# Patient Record
Sex: Male | Born: 1995 | Race: Black or African American | Hispanic: No | Marital: Single | Smoking: Never smoker
Health system: Southern US, Community
[De-identification: ages and names within clinical notes are randomized; demographics above are authoritative.]

## PROBLEM LIST (undated history)

## (undated) DIAGNOSIS — J45909 Unspecified asthma, uncomplicated: Secondary | ICD-10-CM

---
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
## 2017-03-09 ENCOUNTER — Encounter: Payer: Self-pay | Admitting: *Deleted

## 2017-03-09 ENCOUNTER — Encounter: Payer: Self-pay | Admitting: Physician Assistant

## 2017-03-09 ENCOUNTER — Ambulatory Visit (INDEPENDENT_AMBULATORY_CARE_PROVIDER_SITE_OTHER): Payer: Self-pay | Admitting: Physician Assistant

## 2017-03-09 VITALS — BP 146/100 | HR 85 | Temp 99.7°F | Ht 69.5 in | Wt 216.0 lb

## 2017-03-09 DIAGNOSIS — R400 Somnolence: Secondary | ICD-10-CM | POA: Diagnosis not present

## 2017-03-09 NOTE — Patient Instructions (Addendum)
It was great to meet you!  You will be contacted about a sleep study. Please have this completed and if the results are normal, we will be happy to complete your forms.  Please contact us if you have not heard about your sleep test within 1 week.
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
## 2017-03-20 ENCOUNTER — Telehealth: Payer: Self-pay | Admitting: Physician Assistant

## 2017-03-20 NOTE — Telephone Encounter (Signed)
Patient called to check the status of his home sleep test. I was advised by Mellody Dance that he would call the patient once he emails the Pulmonary office to keep the patient updated.

## 2017-03-20 NOTE — Telephone Encounter (Signed)
Called and spoke with patient and let him know sleep study was sent to [facility].

## 2017-03-21 ENCOUNTER — Telehealth: Payer: Self-pay | Admitting: Internal Medicine

## 2017-03-21 NOTE — Telephone Encounter (Signed)
Spoke to pt & gave him date and time for hst.  Nothing further needed.

## 2017-03-21 NOTE — Telephone Encounter (Signed)
Order for home sleep study was placed on 03/09/17 by pt's PCP.  [facility] - can you please contact this pt? Thanks.

## 2017-03-21 NOTE — Telephone Encounter (Signed)
Cordelia Pen has pulled this one to be scheduled she should be calling

## 2017-03-30 DIAGNOSIS — G4733 Obstructive sleep apnea (adult) (pediatric): Secondary | ICD-10-CM

## 2017-03-31 ENCOUNTER — Other Ambulatory Visit: Payer: Self-pay | Admitting: *Deleted

## 2017-03-31 DIAGNOSIS — G4733 Obstructive sleep apnea (adult) (pediatric): Secondary | ICD-10-CM | POA: Diagnosis not present

## 2017-03-31 DIAGNOSIS — R400 Somnolence: Secondary | ICD-10-CM

## 2017-04-04 ENCOUNTER — Telehealth: Payer: Self-pay | Admitting: *Deleted

## 2017-04-04 ENCOUNTER — Telehealth: Payer: Self-pay | Admitting: Internal Medicine

## 2017-04-04 NOTE — Telephone Encounter (Signed)
Results are not scanned back to computer yet for me to access

## 2017-04-04 NOTE — Telephone Encounter (Signed)
Spoke with patient. He is aware of results. Nothing else needed at time of call.  

## 2017-04-04 NOTE — Telephone Encounter (Signed)
Called and spoke with pt and he is aware that we will call once these results have been reviewed by CY.  CY please advise once completed. Thanks

## 2017-04-04 NOTE — Telephone Encounter (Signed)
Sleep study reprinted and placed on CY's cubby.   CY please advise when able.  Thanks!

## 2017-04-04 NOTE — Telephone Encounter (Signed)
Home sleep test showed very minimal obstructive sleep apnea, with 5.8 apneas per hour. The upper limit of normal is 5.0/ hour. We will send a copy of the sleep study to Paul Kim, [place]. She ordered the study, so she can discuss any treatment recommendations as outlined in the report.

## 2017-04-04 NOTE — Telephone Encounter (Signed)
Spoke to pt, told him we received results of sleep study. He has mild sleep apnea. Pt verbalized understanding. Lelon MastSamantha said she will review the forms you need and see if she can sign them now. Need to make an appointment. Pt verbalized understanding and will call back to schedule.

## 2017-04-07 ENCOUNTER — Telehealth: Payer: Self-pay | Admitting: Physician Assistant

## 2017-04-07 ENCOUNTER — Ambulatory Visit (INDEPENDENT_AMBULATORY_CARE_PROVIDER_SITE_OTHER): Payer: Medicaid Other | Admitting: Physician Assistant

## 2017-04-07 ENCOUNTER — Ambulatory Visit: Payer: Self-pay | Admitting: Physician Assistant

## 2017-04-07 ENCOUNTER — Encounter: Payer: Self-pay | Admitting: Physician Assistant

## 2017-04-07 VITALS — BP 138/80 | HR 61 | Temp 98.8°F | Ht 69.5 in | Wt 218.0 lb

## 2017-04-07 DIAGNOSIS — G4733 Obstructive sleep apnea (adult) (pediatric): Secondary | ICD-10-CM

## 2017-04-07 DIAGNOSIS — Z0289 Encounter for other administrative examinations: Secondary | ICD-10-CM

## 2017-04-07 NOTE — Telephone Encounter (Signed)
Left message on voicemail to call office on home number, mobile # unable to leave message voicemail box was not set up yet. [facility] [place]

## 2017-04-07 NOTE — Patient Instructions (Signed)
It was great to see you today!  Please let us know if you have any further questions about the forms that we have completed at today's visit.  Follow-up as needed.

## 2017-04-07 NOTE — Telephone Encounter (Signed)
Patient called in reference to needing AVS from today's visit [facility] [place] at [phone]. Please call patient with any questions and when fax has been sent.

## 2017-04-07 NOTE — Telephone Encounter (Signed)
Patient called in reference to missed call. I informed patient of note below. Patient voiced understanding.
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
## 2017-04-16 ENCOUNTER — Ambulatory Visit (HOSPITAL_COMMUNITY)
Admission: EM | Admit: 2017-04-16 | Discharge: 2017-04-16 | Disposition: A | Discharge status: Home / Self Care | Payer: Worker's Compensation | Attending: Family Medicine | Admitting: Family Medicine

## 2017-04-16 ENCOUNTER — Encounter (HOSPITAL_COMMUNITY): Payer: Self-pay | Admitting: Emergency Medicine

## 2017-04-16 DIAGNOSIS — S61213A Laceration without foreign body of left middle finger without damage to nail, initial encounter: Secondary | ICD-10-CM | POA: Diagnosis not present

## 2017-04-16 NOTE — Discharge Instructions (Signed)
Dermabond will remove on own once wound heals. Do not soak area in water, keep area clean and dry. Take ibuprofen/tylenol for pain. Monitor for any worsening of symptoms, spreading redness, increased warmth, increased pain, follow up for reevaluation.

## 2017-04-16 NOTE — ED Provider Notes (Signed)
[facility]    CSN: [id number] Arrival date & time: 04/16/17  1540     History   Chief Complaint Chief Complaint  Patient presents with  . Laceration    HPI [facility] is a 21 y.o. male.   21 year old male comes in for a few hour history of laceration of left middle finger while at work. Patient states obtained laceration from tea dispenser. Denies pain, numbness/tingling. Bleeding controlled with pressure. Last tetanus injection from routine vaccination.       Past Medical History:  Diagnosis Date  . Asthma    Exercised induced as a child, has outgrown it    There are no active problems to display for this patient.   Past Surgical History:  Procedure Laterality Date  . WISDOM TOOTH EXTRACTION Bilateral 2017       Home Medications    Prior to Admission medications   Medication Sig Start Date End Date Taking? Authorizing Provider  acetaminophen (TYLENOL) 325 MG tablet Take 650 mg by mouth as needed.    [provider]  naproxen sodium (ALEVE) 220 MG tablet Take 440 mg by mouth as needed.    [provider]    Family History Family History  Problem Relation Age of Onset  . Breast cancer Mother   . Diabetes Brother   . Diabetes Maternal Grandmother   . Breast cancer Maternal Grandmother     Social History Social History  Substance Use Topics  . Smoking status: Never Smoker  . Smokeless tobacco: Never Used  . Alcohol use No     Allergies   Patient has no known allergies.   Review of Systems Review of Systems  Constitutional: Negative for chills, diaphoresis and fever.  Skin: Positive for wound.     Physical Exam Triage Vital Signs ED Triage Vitals [04/16/17 1607]  Enc Vitals Group     BP (!) 149/92     Pulse Rate 62     Resp 16     Temp 98.4 F (36.9 C)     Temp Source Oral     SpO2 100 %     Weight      Height      Head Circumference      Peak Flow      Pain Score      Pain Loc    Pain Edu?      Excl. in GC?    No data found.   Updated Vital Signs BP (!) 149/92 (BP Location: Left Arm)   Pulse 62   Temp 98.4 F (36.9 C) (Oral)   Resp 16   SpO2 100%   Physical Exam  Constitutional: He is oriented to person, place, and time. He appears well-developed and well-nourished. No distress.  HENT:  Head: Normocephalic and atraumatic.  Eyes: Pupils are equal, round, and reactive to light. Conjunctivae are normal.  Musculoskeletal:  1cm laceration to palmer aspect of left middle distal finger. Bleeding controlled with pressure. Full ROM of finger. No tendon/bone visible with ROM. Cap refill <2s  Neurological: He is alert and oriented to person, place, and time.     UC Treatments / Results  Labs (all labs ordered are listed, but only abnormal results are displayed) Labs Reviewed - No data to display  EKG  EKG Interpretation None       Radiology No results found.  Procedures .Marland KitchenLaceration Repair Date/Time: 04/16/2017 4:55 PM Performed by: Linward Headland V Authorized by: Elvina Sidle  Consent:    Consent obtained:  Verbal   Consent given by:  Patient   Risks discussed:  Infection, pain, poor cosmetic result and poor wound healing   Alternatives discussed:  No treatment Anesthesia (see MAR for exact dosages):    Anesthesia method:  None Laceration details:    Location:  Finger   Finger location:  L long finger   Length (cm):  1   Depth (mm):  1 Exploration:    Hemostasis achieved with:  Direct pressure and tourniquet   Wound exploration: wound explored through full range of motion and entire depth of wound probed and visualized     Wound extent: no muscle damage noted, no nerve damage noted and no tendon damage noted   Treatment:    Area cleansed with:  Betadine   Amount of cleaning:  Standard   Irrigation solution: wound cleaner.   Irrigation method:  Pressure wash Skin repair:    Repair method:  Tissue adhesive Approximation:     Approximation:  Close   Vermilion border: well-aligned   Post-procedure details:    Dressing:  Splint for protection   Patient tolerance of procedure:  Tolerated well, no immediate complications   (including critical care time)  Medications Ordered in UC Medications - No data to display   Initial Impression / Assessment and Plan / UC Course  I have reviewed the triage vital signs and the nursing notes.  Pertinent labs & imaging results that were available during my care of the patient were reviewed by me and considered in my medical decision making (see chart for details).     Patient tolerated procedure well. Wound care instructions given. Return precautions given.   Final Clinical Impressions(s) / UC Diagnoses   Final diagnoses:  Laceration of left middle finger without foreign body without damage to nail, initial encounter    New Prescriptions New Prescriptions   No medications on file      Belinda Fisher, Cordelia Poche 04/16/17 1658

## 2017-04-16 NOTE — ED Triage Notes (Signed)
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   Last tetanus = unknown... Bleeding controlled.   A&O x4... NAD... Ambulatory

## 2017-04-19 NOTE — Telephone Encounter (Signed)
Patient called and stated that the wrong paperwork was sent to the [facility]. Patient stated that the [facility] needs the medical form where [place] signed off that he can drive again. Please refax the correct forms to the [facility] to their fax [phone]. Patient states that Lelon Mast made a copy and should have that copy to refax the correct form with her signature for driving clearance. Patient would like to be notified once this has been done.

## 2017-04-19 NOTE — Telephone Encounter (Signed)
Spoke to pt, told him I faxed over the form Samantha filled out to [facility] for him. Pt verbalized understanding.
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
## 2017-09-11 ENCOUNTER — Ambulatory Visit: Payer: Self-pay | Admitting: Physician Assistant

## 2017-09-11 ENCOUNTER — Encounter: Payer: Self-pay | Admitting: Physician Assistant

## 2017-09-11 VITALS — BP 130/82 | HR 58 | Temp 98.0°F | Resp 16 | Ht 70.0 in | Wt 224.0 lb

## 2017-09-11 DIAGNOSIS — G4733 Obstructive sleep apnea (adult) (pediatric): Secondary | ICD-10-CM

## 2017-09-11 DIAGNOSIS — Z0289 Encounter for other administrative examinations: Secondary | ICD-10-CM

## 2018-05-18 ENCOUNTER — Encounter: Payer: Medicaid Other | Admitting: Physician Assistant

## 2018-05-18 NOTE — Progress Notes (Deleted)
I acted as a Neurosurgeon for [employer], PA-C Corky Mull, LPN  Subjective:    [facility] is a 22 y.o. male and is here for a comprehensive physical exam.  HPI  Health Maintenance Due  Topic Date Due  . HIV Screening  06/05/2012  . INFLUENZA VACCINE  03/01/2018    Acute Concerns:   Chronic Issues:   Health Maintenance: Immunizations -- *** Colonoscopy -- *** PAP -- *** PSA -- *** Diet -- *** Caffeine intake -- *** Sleep habits -- *** Exercise -- *** Weight --    Weight history Wt Readings from Last 10 Encounters:  09/11/17 224 lb (101.6 kg)  04/07/17 218 lb (98.9 kg) (97 %, Z= 1.81)*  03/09/17 216 lb (98 kg) (96 %, Z= 1.78)*  09/01/14 189 lb 13.1 oz (86.1 kg) (93 %, Z= 1.47)*   * Growth percentiles are based on CDC (Boys, 2-20 Years) data.   Mood -- *** Tobacco use -- *** Alcohol use --- ***  Depression screen PHQ 2/9 03/09/2017  Decreased Interest 0  Down, Depressed, Hopeless 0  PHQ - 2 Score 0     Other providers/specialists:  PMHx, SurgHx, SocialHx, Medications, and Allergies were reviewed in the Visit Navigator and updated as appropriate.   Past Medical History:  Diagnosis Date  . Asthma    Exercised induced as a child, has outgrown it    Past Surgical History:  Procedure Laterality Date  . WISDOM TOOTH EXTRACTION Bilateral 2017    Family History  Problem Relation Age of Onset  . Breast cancer Mother   . Diabetes Brother   . Diabetes Maternal Grandmother   . Breast cancer Maternal Grandmother     Social History   Tobacco Use  . Smoking status: Never Smoker  . Smokeless tobacco: Never Used  Substance Use Topics  . Alcohol use: No  . Drug use: No    Review of Systems:   ROS  Objective:   There were no vitals filed for this visit. There is no height or weight on file to calculate BMI.  General Appearance:  Alert, cooperative, no distress, appears stated age  Head:  Normocephalic, without obvious abnormality,  atraumatic  Eyes:  PERRL, conjunctiva/corneas clear, EOM's intact, fundi benign, both eyes       Ears:  Normal TM's and external ear canals, both ears  Nose: Nares normal, septum midline, mucosa normal, no drainage    or sinus tenderness  Throat: Lips, mucosa, and tongue normal; teeth and gums normal  Neck: Supple, symmetrical, trachea midline, no adenopathy; thyroid:  No enlargement/tenderness/nodules; no carotit bruit or JVD  Back:   Symmetric, no curvature, ROM normal, no CVA tenderness  Lungs:   Clear to auscultation bilaterally, respirations unlabored  Chest wall:  No tenderness or deformity  Heart:  Regular rate and rhythm, S1 and S2 normal, no murmur, rub   or gallop  Abdomen:   Soft, non-tender, bowel sounds active all four quadrants, no masses, no organomegaly  Extremities: Extremities normal, atraumatic, no cyanosis or edema  Prostate: Not done.   Skin: Skin color, texture, turgor normal, no rashes or lesions  Lymph nodes: Cervical, supraclavicular, and axillary nodes normal  Neurologic: CNII-XII grossly intact. Normal strength, sensation and reflexes throughout    Assessment/Plan:   There are no diagnoses linked to this encounter.  Well Adult Exam: Labs ordered: {Yes/No:18319::"Yes"}. Patient counseling was done. See below for items discussed. Discussed the patient's BMI.  The BMI {BMI plan (MU NQF measure  421):19504} Follow up {follow-up interval:13814}.  Patient Counseling: [x]   Nutrition: Stressed importance of moderation in sodium/caffeine intake, saturated fat and cholesterol, caloric balance, sufficient intake of fresh fruits, vegetables, and fiber.  [x]   Stressed the importance of regular exercise.   []   Substance Abuse: Discussed cessation/primary prevention of tobacco, alcohol, or other drug use; driving or other dangerous activities under the influence; availability of treatment for abuse.   [x]   Injury prevention: Discussed safety belts, safety helmets, smoke  detector, smoking near bedding or upholstery.   []   Sexuality: Discussed sexually transmitted diseases, partner selection, use of condoms, avoidance of unintended pregnancy  and contraceptive alternatives.   [x]   Dental health: Discussed importance of regular tooth brushing, flossing, and dental visits.  [x]   Health maintenance and immunizations reviewed. Please refer to Health maintenance section.    ***  Jarold Motto, PA-C [facility]

## 2018-05-21 ENCOUNTER — Encounter: Payer: Self-pay | Admitting: Physician Assistant

## 2018-05-30 ENCOUNTER — Encounter: Payer: Medicaid Other | Admitting: Physician Assistant

## 2018-05-31 ENCOUNTER — Encounter: Payer: Medicaid Other | Admitting: Physician Assistant

## 2018-06-04 ENCOUNTER — Encounter: Payer: Medicaid Other | Admitting: Physician Assistant

## 2018-06-26 ENCOUNTER — Ambulatory Visit: Payer: Self-pay | Admitting: Physician Assistant

## 2018-06-26 ENCOUNTER — Encounter: Payer: Self-pay | Admitting: Physician Assistant

## 2018-06-26 ENCOUNTER — Ambulatory Visit: Payer: Medicaid Other | Admitting: Physician Assistant

## 2018-06-26 VITALS — BP 124/78 | HR 67 | Temp 98.5°F | Ht 70.0 in | Wt 214.4 lb

## 2018-06-26 DIAGNOSIS — Z0289 Encounter for other administrative examinations: Secondary | ICD-10-CM

## 2018-06-26 DIAGNOSIS — G4733 Obstructive sleep apnea (adult) (pediatric): Secondary | ICD-10-CM

## 2018-06-26 NOTE — Patient Instructions (Signed)
It was great to see you!  Take care,  Hinley Brimage PA-C  

## 2018-06-26 NOTE — Progress Notes (Signed)
Paul Kim is a 22 y.o. male here for a follow up of a pre-existing problem.   History of Present Illness:   Chief Complaint  Patient presents with  . Form Completion    HPI   Patient presents today for follow-up on his DMV forms from last year.  He received forms to complete again to review his medical issues specifically his minimal obstructive sleep apnea, and to determine whether or not he is still able to maintain his license.  He continues to work 2 jobs however he says he getting at least 8 hours of sleep per night.  He denies any issues with daytime somnolence, gasping for air in the middle the night, or excessive fatigue.  He has lost about 5 pounds since I last saw him last year.  He is overall doing very well.  He denies any issues with driving at this time.  He wears glasses.  Wt Readings from Last 5 Encounters:  06/26/18 214 lb 6.4 oz (97.3 kg)  09/11/17 224 lb (101.6 kg)  04/07/17 218 lb (98.9 kg) (97 %, Z= 1.81)*  03/09/17 216 lb (98 kg) (96 %, Z= 1.78)*  09/01/14 189 lb 13.1 oz (86.1 kg) (93 %, Z= 1.47)*   * Growth percentiles are based on CDC (Boys, 2-20 Years) data.     Past Medical History:  Diagnosis Date  . Asthma    Exercised induced as a child, has outgrown it     Social History   Socioeconomic History  . Marital status: Single    Spouse name: Not on file  . Number of children: Not on file  . Years of education: Not on file  . Highest education level: Not on file  Occupational History  . Not on file  Social Needs  . Financial resource strain: Not on file  . Food insecurity:    Worry: Not on file    Inability: Not on file  . Transportation needs:    Medical: Not on file    Non-medical: Not on file  Tobacco Use  . Smoking status: Never Smoker  . Smokeless tobacco: Never Used  Substance and Sexual Activity  . Alcohol use: No  . Drug use: No  . Sexual activity: Yes  Lifestyle  . Physical activity:    Days per week: Not on file     Minutes per session: Not on file  . Stress: Not on file  Relationships  . Social connections:    Talks on phone: Not on file    Gets together: Not on file    Attends religious service: Not on file    Active member of club or organization: Not on file    Attends meetings of clubs or organizations: Not on file    Relationship status: Not on file  . Intimate partner violence:    Fear of current or ex partner: Not on file    Emotionally abused: Not on file    Physically abused: Not on file    Forced sexual activity: Not on file  Other Topics Concern  . 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 WISDOM TOOTH EXTRACTION Bilateral 2017    Family History  Problem Relation Age of Onset  . Breast cancer Mother   . Diabetes Brother   . Diabetes Maternal Grandmother   . Breast cancer Maternal Grandmother     No Known Allergies  Current Medications:   Current Outpatient Medications:  .  acetaminophen (TYLENOL) 325 MG tablet, Take 650 mg by mouth as needed., Disp: , Rfl:  .  naproxen sodium (ALEVE) 220 MG tablet, Take 440 mg by mouth as needed., Disp: , Rfl:    Review of Systems:   ROS Negative unless otherwise specified per HPI.  Vitals:   Vitals:   06/26/18 0850  BP: 124/78  Pulse: 67  Temp: 98.5 F (36.9 C)  TempSrc: Oral  SpO2: 98%  Weight: 214 lb 6.4 oz (97.3 kg)  Height: 5\' 10"  (1.778 m)     Body mass index is 30.76 kg/m.  Physical Exam:   Physical Exam  Constitutional: He appears well-developed. He is cooperative.  Non-toxic appearance. He does not have a sickly appearance. He does not appear ill. No distress.  Cardiovascular: Normal rate, regular rhythm, S1 normal, S2 normal, normal heart sounds and normal pulses.  No LE edema  Pulmonary/Chest: Effort normal and breath sounds normal.  Neurological: He is alert. GCS eye subscore is 4. GCS verbal subscore is 5. GCS motor  subscore is 6.  Skin: Skin is warm, dry and intact.  Psychiatric: He has a normal mood and affect. His speech is normal and behavior is normal.  Nursing note and vitals reviewed.   No results found for this or any previous visit.  Assessment and Plan:   Edon was seen today for form completion.  Diagnoses and all orders for this visit:  Encounter for completion of form with patient  Mild obstructive sleep apnea   I have completed patient's DMV form today.  I do feel like he is safe to drive.  Continue to recommend working on getting adequate sleep, and paying attention to when he is starting to feel excessively fatigued.  Follow-up as needed.  . Reviewed expectations re: course of current medical issues. . Discussed self-management of symptoms. . Outlined signs and symptoms indicating need for more acute intervention. . Patient verbalized understanding and all questions were answered. . See orders for this visit as documented in the electronic medical record. . Patient received an After-Visit Summary.   Jarold Motto, PA-C

## 2019-03-29 ENCOUNTER — Telehealth: Payer: Self-pay | Admitting: Physician Assistant

## 2019-03-29 NOTE — Telephone Encounter (Signed)
Tried to contact pt no answer unable to leave message. Papers put upfront for pickup. 

## 2019-03-29 NOTE — Telephone Encounter (Signed)
See note  Copied from [place] [phone]. Topic: General - Other >> Mar 29, 2019  1:52 PM Pauline Good wrote: Reason for CRM: pt need copy of medical review paper that he was given 06/2018. Please call pt to advise when it's ready

## 2019-04-01 NOTE — Telephone Encounter (Signed)
Tried to contact pt no answer unable to leave message. Papers put upfront for pickup.

## 2019-04-04 NOTE — Telephone Encounter (Signed)
Tried to contact pt no answer unable to leave message. Papers put upfront for pickup. 

## 2019-04-05 NOTE — Telephone Encounter (Signed)
Spoke to patient and he has been advised that his ppw is at front desk for p/u at his convenience.  He verbalized understanding.

## 2023-08-10 ENCOUNTER — Encounter (HOSPITAL_COMMUNITY): Payer: Self-pay | Admitting: Emergency Medicine

## 2023-08-10 ENCOUNTER — Ambulatory Visit (HOSPITAL_COMMUNITY)
Admission: EM | Admit: 2023-08-10 | Discharge: 2023-08-10 | Disposition: A | Discharge status: Home / Self Care | Payer: Commercial Managed Care - PPO | Attending: Internal Medicine | Admitting: Internal Medicine

## 2023-08-10 DIAGNOSIS — R062 Wheezing: Secondary | ICD-10-CM | POA: Diagnosis not present

## 2023-08-10 DIAGNOSIS — J209 Acute bronchitis, unspecified: Secondary | ICD-10-CM

## 2023-08-10 DIAGNOSIS — R051 Acute cough: Secondary | ICD-10-CM

## 2023-08-10 MED ORDER — ALBUTEROL SULFATE HFA 108 (90 BASE) MCG/ACT IN AERS: 2.0000 | INHALATION_SPRAY | RESPIRATORY_TRACT | 0 refills | Status: AC | PRN

## 2023-08-10 MED ORDER — AZITHROMYCIN 250 MG PO TABS: ORAL_TABLET | ORAL | 0 refills | Status: AC

## 2023-08-10 MED ORDER — ALBUTEROL SULFATE (2.5 MG/3ML) 0.083% IN NEBU
INHALATION_SOLUTION | RESPIRATORY_TRACT | Status: AC
  Filled 2023-08-10: qty 3

## 2023-08-10 MED ORDER — ALBUTEROL SULFATE (2.5 MG/3ML) 0.083% IN NEBU
2.5000 mg | INHALATION_SOLUTION | Freq: Once | RESPIRATORY_TRACT | Status: AC
  Administered 2023-08-10: 2.5 mg via RESPIRATORY_TRACT

## 2023-08-10 NOTE — ED Triage Notes (Signed)
 Pt reports has sore throat for about 4 days. Has cough that does get up phlegm and has congestion, does work in and out of a freezer. Tried cough medications and cough drops and spray.

## 2023-08-10 NOTE — ED Provider Notes (Signed)
 [facility]    CSN: [id number] Arrival date & time: 08/10/23  1627      History   Chief Complaint Chief Complaint  Patient presents with   Sore Throat    HPI Paul Kim is a 28 y.o. male who presents with onser of ST 4 days ago  and has productive cough with yellow mucous x 4 days. Denies body aches and HA, fever, fatigue, sweats or decreased appetite. He works in a freezer. Has been taking OTC medication which have not helped.     Past Medical History:  Diagnosis Date   Asthma    Exercised induced as a child, has outgrown it    There are no active problems to display for this patient.   Past Surgical History:  Procedure Laterality Date   WISDOM TOOTH EXTRACTION Bilateral 2017       Home Medications    Prior to Admission medications   Medication Sig Start Date End Date Taking? Authorizing Provider  albuterol  (VENTOLIN  HFA) 108 (90 Base) MCG/ACT inhaler Inhale 2 puffs into the lungs every 4 (four) hours as needed for wheezing or shortness of breath. 08/10/23  Yes Rodriguez-Southworth, Niani Mourer, PA-C  azithromycin  (ZITHROMAX  Z-PAK) 250 MG tablet 2 today,then one every day x 4 days 08/10/23  Yes Rodriguez-Southworth, Kyra, PA-C    Family History Family History  Problem Relation Age of Onset   Breast cancer Mother    Diabetes Brother    Diabetes Maternal Grandmother    Breast cancer Maternal Grandmother     Social History Social History   Tobacco Use   Smoking status: Never   Smokeless tobacco: Never  Vaping Use   Vaping status: Never Used  Substance Use Topics   Alcohol use: No   Drug use: No     Allergies   Patient has no known allergies.   Review of Systems Review of Systems  As noted in HPI  Physical Exam Triage Vital Signs ED Triage Vitals  Encounter Vitals Group     BP 08/10/23 1648 (!) 139/92     Systolic BP Percentile --      Diastolic BP Percentile --      Pulse Rate 08/10/23 1648 87     Resp 08/10/23 1648 15      Temp 08/10/23 1648 98.7 F (37.1 C)     Temp Source 08/10/23 1648 Oral     SpO2 08/10/23 1648 96 %     Weight --      Height --      Head Circumference --      Peak Flow --      Pain Score 08/10/23 1647 5     Pain Loc --      Pain Education --      Exclude from Growth Chart --    No data found.  Updated Vital Signs BP (!) 139/92 (BP Location: Right Arm)   Pulse 87   Temp 98.7 F (37.1 C) (Oral)   Resp 15   SpO2 96%   Visual Acuity Right Eye Distance:   Left Eye Distance:   Bilateral Distance:    Right Eye Near:   Left Eye Near:    Bilateral Near:     Physical Exam Physical Exam Constitutional:      General: He is not in acute distress.    Appearance: He is not toxic-appearing.  HENT:     Head: Normocephalic.     Right Ear: Tympanic membrane, ear canal and  external ear normal.     Left Ear: Ear canal and external ear normal.     Nose: Nose normal.     Mouth/Throat:     Mouth: Mucous membranes are moist.     Pharynx: Oropharynx is clear.  Eyes:     General: No scleral icterus.    Conjunctiva/sclera: Conjunctivae normal.  Cardiovascular:     Rate and Rhythm: Normal rate and regular rhythm.     Heart sounds: No murmur heard.   Pulmonary:     Effort: Pulmonary effort is normal. No respiratory distress.     Breath sounds: Wheezing present.     Comments: Has auditory wheezing R>L and after neb treatment only had mild wheezing left on L lung. Repeate pulse ox after neb was 98% Musculoskeletal:        General: Normal range of motion.     Cervical back: Neck supple.  Lymphadenopathy:     Cervical: No cervical adenopathy.  Skin:    General: Skin is warm and dry.     Findings: No rash.  Neurological:     Mental Status: He is alert and oriented to person, place, and time.     Gait: Gait normal.  Psychiatric:        Mood and Affect: Mood normal.        Behavior: Behavior normal.        Thought Content: Thought content normal.        Judgment: Judgment  normal.    UC Treatments / Results  Labs (all labs ordered are listed, but only abnormal results are displayed) Labs Reviewed - No data to display  EKG   Radiology No results found.  Procedures Procedures (including critical care time)  Medications Ordered in UC Medications  albuterol  (PROVENTIL ) (2.5 MG/3ML) 0.083% nebulizer solution 2.5 mg (2.5 mg Nebulization Given 08/10/23 1709)    Initial Impression / Assessment and Plan / UC Course  I have reviewed the triage vital signs and the nursing notes.  Acute bronchitis  He was given albuterol  neb treatment which helped the wheezing almost completely resolve I placed him on Zpack and Albuterol  inhaler as noted.     Final Clinical Impressions(s) / UC Diagnoses   Final diagnoses:  Acute cough  Wheezing  Acute bronchitis, unspecified organism   Discharge Instructions   None    ED Prescriptions     Medication Sig Dispense Auth. Provider   albuterol  (VENTOLIN  HFA) 108 (90 Base) MCG/ACT inhaler Inhale 2 puffs into the lungs every 4 (four) hours as needed for wheezing or shortness of breath. 8 g Rodriguez-Southworth, Ahmani Prehn, PA-C   azithromycin  (ZITHROMAX  Z-PAK) 250 MG tablet 2 today,then one every day x 4 days 6 tablet Rodriguez-Southworth, Kortny Lirette, PA-C      PDMP not reviewed this encounter.   Lindi Carter, [place] 08/10/23 1735
